# Patient Record
Sex: Male | Born: 1975 | Race: White | Hispanic: No | Marital: Married | Smoking: Never smoker
Health system: Southern US, Community
[De-identification: ages and names within clinical notes are randomized; demographics above are authoritative.]

## PROBLEM LIST (undated history)

## (undated) DIAGNOSIS — I10 Essential (primary) hypertension: Secondary | ICD-10-CM

## (undated) DIAGNOSIS — M5126 Other intervertebral disc displacement, lumbar region: Secondary | ICD-10-CM

## (undated) HISTORY — PX: ANTERIOR CRUCIATE LIGAMENT REPAIR: SHX115

## (undated) HISTORY — DX: Essential (primary) hypertension: I10

## (undated) HISTORY — PX: LUMBAR EPIDURAL INJECTION: SHX1980

---

## 2018-11-23 DIAGNOSIS — I10 Essential (primary) hypertension: Secondary | ICD-10-CM | POA: Insufficient documentation

## 2021-04-27 ENCOUNTER — Other Ambulatory Visit: Payer: Self-pay | Admitting: Sports Medicine

## 2021-04-27 DIAGNOSIS — G8929 Other chronic pain: Secondary | ICD-10-CM

## 2021-04-27 DIAGNOSIS — M5442 Lumbago with sciatica, left side: Secondary | ICD-10-CM

## 2021-04-27 DIAGNOSIS — M5136 Other intervertebral disc degeneration, lumbar region: Secondary | ICD-10-CM

## 2021-05-04 ENCOUNTER — Ambulatory Visit
Admission: RE | Admit: 2021-05-04 | Discharge: 2021-05-04 | Disposition: A | Discharge status: Home / Self Care | Payer: 59 | Source: Ambulatory Visit | Attending: Sports Medicine | Admitting: Sports Medicine

## 2021-05-04 ENCOUNTER — Other Ambulatory Visit: Payer: Self-pay

## 2021-05-04 DIAGNOSIS — M5136 Other intervertebral disc degeneration, lumbar region: Secondary | ICD-10-CM | POA: Diagnosis present

## 2021-05-04 DIAGNOSIS — G8929 Other chronic pain: Secondary | ICD-10-CM | POA: Diagnosis present

## 2021-05-04 DIAGNOSIS — M5442 Lumbago with sciatica, left side: Secondary | ICD-10-CM | POA: Insufficient documentation

## 2021-05-20 ENCOUNTER — Other Ambulatory Visit (INDEPENDENT_AMBULATORY_CARE_PROVIDER_SITE_OTHER): Payer: Self-pay | Admitting: Vascular Surgery

## 2021-05-20 ENCOUNTER — Ambulatory Visit (INDEPENDENT_AMBULATORY_CARE_PROVIDER_SITE_OTHER): Payer: 59

## 2021-05-20 ENCOUNTER — Other Ambulatory Visit: Payer: Self-pay

## 2021-05-20 DIAGNOSIS — R238 Other skin changes: Secondary | ICD-10-CM | POA: Diagnosis not present

## 2021-05-20 DIAGNOSIS — M79602 Pain in left arm: Secondary | ICD-10-CM

## 2021-05-20 DIAGNOSIS — M79601 Pain in right arm: Secondary | ICD-10-CM

## 2021-05-23 DIAGNOSIS — I998 Other disorder of circulatory system: Secondary | ICD-10-CM | POA: Insufficient documentation
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
## 2021-05-24 ENCOUNTER — Ambulatory Visit (INDEPENDENT_AMBULATORY_CARE_PROVIDER_SITE_OTHER): Payer: 59 | Admitting: Vascular Surgery

## 2021-05-24 ENCOUNTER — Encounter (INDEPENDENT_AMBULATORY_CARE_PROVIDER_SITE_OTHER): Payer: Self-pay | Admitting: Vascular Surgery

## 2021-05-24 ENCOUNTER — Other Ambulatory Visit: Payer: Self-pay

## 2021-05-24 VITALS — BP 155/100 | HR 52 | Resp 16 | Ht 68.5 in | Wt 183.8 lb

## 2021-05-24 DIAGNOSIS — I10 Essential (primary) hypertension: Secondary | ICD-10-CM

## 2021-05-24 DIAGNOSIS — I742 Embolism and thrombosis of arteries of the upper extremities: Secondary | ICD-10-CM | POA: Diagnosis not present

## 2021-05-24 DIAGNOSIS — I998 Other disorder of circulatory system: Secondary | ICD-10-CM

## 2021-05-25 NOTE — Progress Notes (Signed)
MRN : [id number]  Andrew Navarro is a 45 y.o. (1975/11/22) male who presents with chief complaint of cold fingers.  History of Present Illness:   The patient is seen for follow up evaluation of ischemic right fingers status post CT angiogram. CT scan was done 05/26/2021. Patient reports that the test went well with no problems or complications.   The patient denies interval increase in his hand symptoms. No new ulcers or fissures have occurred.   The patient is taking enteric-coated aspirin 325 mg daily.  There is no history of migraine headaches. There is no history of seizures.  The patient has a history of coronary artery disease, no recent episodes of angina or shortness of breath. The patient denies PAD or claudication symptoms. There is a history of hyperlipidemia which is being treated with a statin.    CT angiogram is reviewed by me personally and shows normal arterial system from the aorta to the wrist. 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    No outpatient medications have been marked as taking for the 05/27/21 encounter (Appointment) with Gilda Crease, Latina Craver, MD.    Past Medical History:  Diagnosis Date   Hypertension     Past Surgical History:  Procedure Laterality Date   ANTERIOR CRUCIATE LIGAMENT REPAIR Left    20 years ago    Social History Social History   Tobacco Use   Smoking status: Never   Smokeless tobacco: Never  Substance Use Topics   Alcohol use: Yes    Comment: socially   Drug use: Never    Family History Family History  Problem Relation Age of Onset   Hypertension Mother    Hypertension Father     No Known Allergies   REVIEW OF SYSTEMS (Negative unless checked)  Constitutional: [] Weight loss  [] Fever  [] Chills Cardiac: [] Chest pain   [] Chest pressure   [] Palpitations   [] Shortness of breath when laying flat   [] Shortness of breath with exertion. Vascular:  [] Pain in legs with walking   [] Pain in legs at rest   [] History of DVT   [] Phlebitis   [] Swelling in legs   [] Varicose veins   [] Non-healing ulcers Pulmonary:   [] Uses home oxygen   [] Productive cough   [] Hemoptysis   [] Wheeze  [] COPD   [] Asthma Neurologic:  [] Dizziness   [] Seizures   [] History of stroke   [] History of TIA  [] Aphasia   [] Vissual changes   [] Weakness or numbness in arm   [] Weakness or numbness in leg Musculoskeletal:   [] Joint swelling   [] Joint pain   [] Low back pain Hematologic:  [] Easy bruising  [] Easy bleeding   [] Hypercoagulable state   [] Anemic Gastrointestinal:  [] Diarrhea   [] Vomiting  [] Gastroesophageal reflux/heartburn   [] Difficulty swallowing. Genitourinary:  [] Chronic kidney disease   [] Difficult urination  [] Frequent urination   [] Blood in urine Skin:  [] Rashes   [] Ulcers  Psychological:  [] History of anxiety   []  History of major depression.  Physical Examination  There were no vitals filed for this visit. There is no height or weight on file to calculate BMI. Gen: WD/WN, NAD Head: [place]/AT, No temporalis wasting.  Ear/Nose/Throat: Hearing grossly intact, nares w/o erythema or drainage Eyes: PER, EOMI, sclera nonicteric.  Neck: Supple, no masses.  No bruit or JVD.  Pulmonary:  Good air movement, no audible wheezing, no use of accessory muscles.  Cardiac: RRR, normal S1, S2, no Murmurs. Vascular:   right 3rd, 4th and 5th fingers with some  color change, cool to touch Vessel Right Left  Radial Palpable Palpable  Gastrointestinal: soft, non-distended. No guarding/no peritoneal signs.  Musculoskeletal: M/S 5/5 throughout.  No visible deformity.  Neurologic: CN 2-12 intact. Pain and light touch intact in extremities.  Symmetrical.  Speech is fluent. Motor exam as listed above. Psychiatric: Judgment intact, Mood & affect appropriate for pt's clinical situation. Dermatologic: No rashes or ulcers noted.  No changes consistent with cellulitis.   CBC No results found for: WBC, HGB, HCT, MCV, PLT  BMET No results found  for: NA, K, CL, CO2, GLUCOSE, BUN, CREATININE, CALCIUM, GFRNONAA, GFRAA CrCl cannot be calculated (No successful lab value found.).  COAG No results found for: INR, PROTIME  Radiology MR LUMBAR SPINE WO CONTRAST  Result Date: 05/05/2021 CLINICAL DATA:  Chronic left-sided low back pain with left-sided sciatica M54.42, G89.29 (ICD-10-CM)DDD (degenerative disc disease), lumbar M51.36 (ICD-10-CM). Additional history provided by scanning technologist: Patient reports low back pain with left leg pain for 4-5 months. EXAM: MRI LUMBAR SPINE WITHOUT CONTRAST TECHNIQUE: Multiplanar, multisequence MR imaging of the lumbar spine was performed. No intravenous contrast was administered. COMPARISON:  No pertinent prior exams available for comparison. FINDINGS: Segmentation: Transitional lumbosacral anatomy. For purposes of this dictation, the S1 vertebra is transitional. Rudimentary disc space and right-sided assimilation joint at S1-S2. Alignment:  No significant spondylolisthesis. Vertebrae: No vertebral compression deformity. Small Schmorl node within the L2 superior endplate. Small multilevel vertebral body hemangiomas. No significant marrow edema or focal suspicious osseous lesion. Conus medullaris and cauda equina: Conus extends to the T12 level. The distal spinal cord is only minimally included in the field of view. Paraspinal and other soft tissues: No abnormality identified within included portions of the abdomen/retroperitoneum. Paraspinal soft tissues unremarkable. Disc levels: Minimal disc degeneration at L4. Mild-to-moderate disc degeneration at L4-L5. Intervertebral disc height is otherwise maintained. T12-L1: No significant disc herniation or stenosis. L1-L2: No significant disc herniation or stenosis. L2-L3: Tiny left foraminal/extraforaminal disc protrusion (series 8, image 16) (series 5, image 12). No significant spinal canal or foraminal stenosis. L3-L4: No significant disc herniation or stenosis.  L4-L5: Small disc bulge with endplate spurring. Superimposed broad-based left subarticular/foraminal disc protrusion at site of posterior annular fissure. Mild facet arthrosis/ligamentum flavum hypertrophy on the left. The disc protrusion contributes to mild left subarticular narrowing without appreciable nerve root impingement. Central canal patent. Borderline mild right neural foraminal narrowing. Mild/moderate left neural foraminal narrowing. L5-S1: Right foraminal zone posterior annular fissure. Disc bulge with endplate spurring. Superimposed broad-based left center to left foraminal disc protrusion. The disc protrusion results in left subarticular stenosis, contacting and posteriorly displacing the descending left S1 nerve root. The disc protrusion also contributes to moderate left neural foraminal narrowing, contacting the exiting left L5 nerve root. IMPRESSION: Transitional lumbosacral anatomy, as described. Lumbar spondylosis, as outlined and with findings most notably as follows. At L5-S1, there is mild-to-moderate disc degeneration. Right foraminal zone posterior annular fissure. Disc bulge with endplate spurring. Superimposed broad-based left center to left foraminal disc protrusion. The disc protrusion results in left subarticular stenosis, contacting and posteriorly displacing the descending left S1 nerve root. Correlate for left S1 radiculopathy. The disc protrusion also contributes to moderate left neural foraminal narrowing, contacting the exiting left L5 nerve root. Correlate for left L5 radiculopathy. At L4-L5, there is mild disc degeneration. Small disc bulge with endplate spurring. Superimposed broad-based left subarticular/foraminal disc protrusion at site of posterior annular fissure. Mild facet arthrosis/ligamentum flavum hypertrophy on the left. The disc protrusion contributes to  mild left subarticular narrowing without appreciable nerve root impingement. Mild/moderate left neural foraminal  narrowing. Borderline mild right neural foraminal narrowing. No significant spinal canal or foraminal stenosis at the remaining levels. Electronically Signed   By: Jackey Loge DO   On: 05/05/2021 16:35   VAS [place] UPPER EXTREMITY VENOUS DUPLEX  Result Date: 05/24/2021 UPPER VENOUS STUDY  Patient Name:  AVISH TORRY  Date of Exam:   05/20/2021 Medical Rec #: [id number]      Accession #:    [id number] Date of Birth: 12/03/1975     Patient Gender: M Patient Age:   83 years Exam [facility] Procedure:      VAS [place] UPPER EXTREMITY VENOUS DUPLEX Referring Phys: Levora Dredge --------------------------------------------------------------------------------  Other Indications: Right hand pain s/p body building. Performing Technologist: Salvadore Farber RVT  Examination Guidelines: A complete evaluation includes B-mode imaging, spectral Doppler, color Doppler, and power Doppler as needed of all accessible portions of each vessel. Bilateral testing is considered an integral part of a complete examination. Limited examinations for reoccurring indications may be performed as noted.  Right Findings: +----------+------------+---------+-----------+----------+-------+ RIGHT     CompressiblePhasicitySpontaneousPropertiesSummary +----------+------------+---------+-----------+----------+-------+ IJV           Full       Yes       Yes                      +----------+------------+---------+-----------+----------+-------+ Subclavian    Full       Yes       Yes                      +----------+------------+---------+-----------+----------+-------+ Axillary      Full       Yes       Yes                      +----------+------------+---------+-----------+----------+-------+ Brachial      Full       Yes       Yes                      +----------+------------+---------+-----------+----------+-------+ Radial        Full       Yes       Yes                       +----------+------------+---------+-----------+----------+-------+ Ulnar         Full       Yes       Yes                      +----------+------------+---------+-----------+----------+-------+ Cephalic      Full       Yes       Yes                      +----------+------------+---------+-----------+----------+-------+ Basilic       Full       Yes       Yes                      +----------+------------+---------+-----------+----------+-------+  Summary:  Right: No evidence of deep vein thrombosis in the upper extremity. No evidence of superficial vein thrombosis in the upper extremity. Added arterial duplex of radial and ulnar arteries shows the distal radial artery is occluded 1cm from wrist and a collateral  is seen with retrograde flow. Palmar arch waveforms appear diminished. Added right digit waveforms show diminished flow in middle, 4th, and 5th digits.  *See table(s) above for measurements and observations.  Diagnosing physician: Levora Dredge MD Electronically signed by Levora Dredge MD on 05/24/2021 at 8:24:22 AM.    Final      Assessment/Plan 1. 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  This represents  ischemia of the fingers and places the patient at possible risk for finger loss.  Patient should undergo angiography of the right arm  with the hope for intervention.  The risks and benefits as well as the alternative therapies was discussed in detail with the patient.  All questions were answered.  Patient agrees to proceed with angiography.  The patient will follow up with me in the office after the procedure.       2. Ulnar artery thrombus, right (HCC) See #1  3. Essential hypertension Continue antihypertensive medications as already ordered, these medications have been reviewed and there are no changes at this time.     Levora Dredge, MD  05/25/2021 10:11 AM

## 2021-05-25 NOTE — H&P (View-Only) (Signed)
MRN : [id number]  Andrew Navarro is a 45 y.o. (1975/11/22) male who presents with chief complaint of cold fingers.  History of Present Illness:   The patient is seen for follow up evaluation of ischemic right fingers status post CT angiogram. CT scan was done 05/26/2021. Patient reports that the test went well with no problems or complications.   The patient denies interval increase in his hand symptoms. No new ulcers or fissures have occurred.   The patient is taking enteric-coated aspirin 325 mg daily.  There is no history of migraine headaches. There is no history of seizures.  The patient has a history of coronary artery disease, no recent episodes of angina or shortness of breath. The patient denies PAD or claudication symptoms. There is a history of hyperlipidemia which is being treated with a statin.    CT angiogram is reviewed by me personally and shows normal arterial system from the aorta to the wrist. 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    No outpatient medications have been marked as taking for the 05/27/21 encounter (Appointment) with Gilda Crease, Latina Craver, MD.    Past Medical History:  Diagnosis Date   Hypertension     Past Surgical History:  Procedure Laterality Date   ANTERIOR CRUCIATE LIGAMENT REPAIR Left    20 years ago    Social History Social History   Tobacco Use   Smoking status: Never   Smokeless tobacco: Never  Substance Use Topics   Alcohol use: Yes    Comment: socially   Drug use: Never    Family History Family History  Problem Relation Age of Onset   Hypertension Mother    Hypertension Father     No Known Allergies   REVIEW OF SYSTEMS (Negative unless checked)  Constitutional: [] Weight loss  [] Fever  [] Chills Cardiac: [] Chest pain   [] Chest pressure   [] Palpitations   [] Shortness of breath when laying flat   [] Shortness of breath with exertion. Vascular:  [] Pain in legs with walking   [] Pain in legs at rest   [] History of DVT   [] Phlebitis   [] Swelling in legs   [] Varicose veins   [] Non-healing ulcers Pulmonary:   [] Uses home oxygen   [] Productive cough   [] Hemoptysis   [] Wheeze  [] COPD   [] Asthma Neurologic:  [] Dizziness   [] Seizures   [] History of stroke   [] History of TIA  [] Aphasia   [] Vissual changes   [] Weakness or numbness in arm   [] Weakness or numbness in leg Musculoskeletal:   [] Joint swelling   [] Joint pain   [] Low back pain Hematologic:  [] Easy bruising  [] Easy bleeding   [] Hypercoagulable state   [] Anemic Gastrointestinal:  [] Diarrhea   [] Vomiting  [] Gastroesophageal reflux/heartburn   [] Difficulty swallowing. Genitourinary:  [] Chronic kidney disease   [] Difficult urination  [] Frequent urination   [] Blood in urine Skin:  [] Rashes   [] Ulcers  Psychological:  [] History of anxiety   []  History of major depression.  Physical Examination  There were no vitals filed for this visit. There is no height or weight on file to calculate BMI. Gen: WD/WN, NAD Head: [place]/AT, No temporalis wasting.  Ear/Nose/Throat: Hearing grossly intact, nares w/o erythema or drainage Eyes: PER, EOMI, sclera nonicteric.  Neck: Supple, no masses.  No bruit or JVD.  Pulmonary:  Good air movement, no audible wheezing, no use of accessory muscles.  Cardiac: RRR, normal S1, S2, no Murmurs. Vascular:   right 3rd, 4th and 5th fingers with some  color change, cool to touch Vessel Right Left  Radial Palpable Palpable  Gastrointestinal: soft, non-distended. No guarding/no peritoneal signs.  Musculoskeletal: M/S 5/5 throughout.  No visible deformity.  Neurologic: CN 2-12 intact. Pain and light touch intact in extremities.  Symmetrical.  Speech is fluent. Motor exam as listed above. Psychiatric: Judgment intact, Mood & affect appropriate for pt's clinical situation. Dermatologic: No rashes or ulcers noted.  No changes consistent with cellulitis.   CBC No results found for: WBC, HGB, HCT, MCV, PLT  BMET No results found  for: NA, K, CL, CO2, GLUCOSE, BUN, CREATININE, CALCIUM, GFRNONAA, GFRAA CrCl cannot be calculated (No successful lab value found.).  COAG No results found for: INR, PROTIME  Radiology MR LUMBAR SPINE WO CONTRAST  Result Date: 05/05/2021 CLINICAL DATA:  Chronic left-sided low back pain with left-sided sciatica M54.42, G89.29 (ICD-10-CM)DDD (degenerative disc disease), lumbar M51.36 (ICD-10-CM). Additional history provided by scanning technologist: Patient reports low back pain with left leg pain for 4-5 months. EXAM: MRI LUMBAR SPINE WITHOUT CONTRAST TECHNIQUE: Multiplanar, multisequence MR imaging of the lumbar spine was performed. No intravenous contrast was administered. COMPARISON:  No pertinent prior exams available for comparison. FINDINGS: Segmentation: Transitional lumbosacral anatomy. For purposes of this dictation, the S1 vertebra is transitional. Rudimentary disc space and right-sided assimilation joint at S1-S2. Alignment:  No significant spondylolisthesis. Vertebrae: No vertebral compression deformity. Small Schmorl node within the L2 superior endplate. Small multilevel vertebral body hemangiomas. No significant marrow edema or focal suspicious osseous lesion. Conus medullaris and cauda equina: Conus extends to the T12 level. The distal spinal cord is only minimally included in the field of view. Paraspinal and other soft tissues: No abnormality identified within included portions of the abdomen/retroperitoneum. Paraspinal soft tissues unremarkable. Disc levels: Minimal disc degeneration at L4. Mild-to-moderate disc degeneration at L4-L5. Intervertebral disc height is otherwise maintained. T12-L1: No significant disc herniation or stenosis. L1-L2: No significant disc herniation or stenosis. L2-L3: Tiny left foraminal/extraforaminal disc protrusion (series 8, image 16) (series 5, image 12). No significant spinal canal or foraminal stenosis. L3-L4: No significant disc herniation or stenosis.  L4-L5: Small disc bulge with endplate spurring. Superimposed broad-based left subarticular/foraminal disc protrusion at site of posterior annular fissure. Mild facet arthrosis/ligamentum flavum hypertrophy on the left. The disc protrusion contributes to mild left subarticular narrowing without appreciable nerve root impingement. Central canal patent. Borderline mild right neural foraminal narrowing. Mild/moderate left neural foraminal narrowing. L5-S1: Right foraminal zone posterior annular fissure. Disc bulge with endplate spurring. Superimposed broad-based left center to left foraminal disc protrusion. The disc protrusion results in left subarticular stenosis, contacting and posteriorly displacing the descending left S1 nerve root. The disc protrusion also contributes to moderate left neural foraminal narrowing, contacting the exiting left L5 nerve root. IMPRESSION: Transitional lumbosacral anatomy, as described. Lumbar spondylosis, as outlined and with findings most notably as follows. At L5-S1, there is mild-to-moderate disc degeneration. Right foraminal zone posterior annular fissure. Disc bulge with endplate spurring. Superimposed broad-based left center to left foraminal disc protrusion. The disc protrusion results in left subarticular stenosis, contacting and posteriorly displacing the descending left S1 nerve root. Correlate for left S1 radiculopathy. The disc protrusion also contributes to moderate left neural foraminal narrowing, contacting the exiting left L5 nerve root. Correlate for left L5 radiculopathy. At L4-L5, there is mild disc degeneration. Small disc bulge with endplate spurring. Superimposed broad-based left subarticular/foraminal disc protrusion at site of posterior annular fissure. Mild facet arthrosis/ligamentum flavum hypertrophy on the left. The disc protrusion contributes to   mild left subarticular narrowing without appreciable nerve root impingement. Mild/moderate left neural foraminal  narrowing. Borderline mild right neural foraminal narrowing. No significant spinal canal or foraminal stenosis at the remaining levels. Electronically Signed   By: Jackey Loge DO   On: 05/05/2021 16:35   VAS [place] UPPER EXTREMITY VENOUS DUPLEX  Result Date: 05/24/2021 UPPER VENOUS STUDY  Patient Name:  Andrew Navarro  Date of Exam:   05/20/2021 Medical Rec #: [id number]      Accession #:    [id number] Date of Birth: 12/03/1975     Patient Gender: M Patient Age:   83 years Exam Location:  [facility] Procedure:      VAS [place] UPPER EXTREMITY VENOUS DUPLEX Referring Phys: Levora Dredge --------------------------------------------------------------------------------  Other Indications: Right hand pain s/p body building. Performing Technologist: Salvadore Farber RVT  Examination Guidelines: A complete evaluation includes B-mode imaging, spectral Doppler, color Doppler, and power Doppler as needed of all accessible portions of each vessel. Bilateral testing is considered an integral part of a complete examination. Limited examinations for reoccurring indications may be performed as noted.  Right Findings: +----------+------------+---------+-----------+----------+-------+ RIGHT     CompressiblePhasicitySpontaneousPropertiesSummary +----------+------------+---------+-----------+----------+-------+ IJV           Full       Yes       Yes                      +----------+------------+---------+-----------+----------+-------+ Subclavian    Full       Yes       Yes                      +----------+------------+---------+-----------+----------+-------+ Axillary      Full       Yes       Yes                      +----------+------------+---------+-----------+----------+-------+ Brachial      Full       Yes       Yes                      +----------+------------+---------+-----------+----------+-------+ Radial        Full       Yes       Yes                       +----------+------------+---------+-----------+----------+-------+ Ulnar         Full       Yes       Yes                      +----------+------------+---------+-----------+----------+-------+ Cephalic      Full       Yes       Yes                      +----------+------------+---------+-----------+----------+-------+ Basilic       Full       Yes       Yes                      +----------+------------+---------+-----------+----------+-------+  Summary:  Right: No evidence of deep vein thrombosis in the upper extremity. No evidence of superficial vein thrombosis in the upper extremity. Added arterial duplex of radial and ulnar arteries shows the distal radial artery is occluded 1cm from wrist and a collateral  is seen with retrograde flow. Palmar arch waveforms appear diminished. Added right digit waveforms show diminished flow in middle, 4th, and 5th digits.  *See table(s) above for measurements and observations.  Diagnosing physician: Levora Dredge MD Electronically signed by Levora Dredge MD on 05/24/2021 at 8:24:22 AM.    Final      Assessment/Plan 1. 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  This represents  ischemia of the fingers and places the patient at possible risk for finger loss.  Patient should undergo angiography of the right arm  with the hope for intervention.  The risks and benefits as well as the alternative therapies was discussed in detail with the patient.  All questions were answered.  Patient agrees to proceed with angiography.  The patient will follow up with me in the office after the procedure.       2. Ulnar artery thrombus, right (HCC) See #1  3. Essential hypertension Continue antihypertensive medications as already ordered, these medications have been reviewed and there are no changes at this time.     Levora Dredge, MD  05/25/2021 10:11 AM

## 2021-05-26 ENCOUNTER — Other Ambulatory Visit: Payer: Self-pay

## 2021-05-26 ENCOUNTER — Ambulatory Visit
Admission: RE | Admit: 2021-05-26 | Discharge: 2021-05-26 | Disposition: A | Discharge status: Home / Self Care | Payer: 59 | Source: Ambulatory Visit | Attending: Vascular Surgery | Admitting: Vascular Surgery

## 2021-05-26 DIAGNOSIS — I742 Embolism and thrombosis of arteries of the upper extremities: Secondary | ICD-10-CM | POA: Diagnosis present

## 2021-05-26 DIAGNOSIS — I998 Other disorder of circulatory system: Secondary | ICD-10-CM

## 2021-05-26 LAB — POCT I-STAT CREATININE: Creatinine, Ser: 1.1 mg/dL (ref 0.61–1.24)

## 2021-05-26 MED ORDER — IOHEXOL 350 MG/ML SOLN
125.0000 mL | Freq: Once | INTRAVENOUS | Status: AC | PRN
  Administered 2021-05-26: 125 mL via INTRAVENOUS

## 2021-05-27 ENCOUNTER — Encounter (INDEPENDENT_AMBULATORY_CARE_PROVIDER_SITE_OTHER): Payer: Self-pay

## 2021-05-27 ENCOUNTER — Encounter (INDEPENDENT_AMBULATORY_CARE_PROVIDER_SITE_OTHER): Payer: Self-pay | Admitting: Vascular Surgery

## 2021-05-27 ENCOUNTER — Telehealth (INDEPENDENT_AMBULATORY_CARE_PROVIDER_SITE_OTHER): Payer: Self-pay

## 2021-05-27 ENCOUNTER — Ambulatory Visit (INDEPENDENT_AMBULATORY_CARE_PROVIDER_SITE_OTHER): Payer: 59 | Admitting: Vascular Surgery

## 2021-05-27 VITALS — BP 132/85 | HR 58 | Resp 19 | Ht 68.0 in | Wt 180.0 lb

## 2021-05-27 DIAGNOSIS — I10 Essential (primary) hypertension: Secondary | ICD-10-CM

## 2021-05-27 DIAGNOSIS — I998 Other disorder of circulatory system: Secondary | ICD-10-CM | POA: Diagnosis not present

## 2021-05-27 DIAGNOSIS — I742 Embolism and thrombosis of arteries of the upper extremities: Secondary | ICD-10-CM | POA: Diagnosis not present

## 2021-05-27 NOTE — Telephone Encounter (Signed)
Patient has been schedule for right upper extremity angio with Dr Gilda Crease on 06/01/21 arrival time 11:30 at the [facility]. I left a voicemail for the patient return a call back to go over the pre-procedure instructions.

## 2021-05-30 ENCOUNTER — Encounter (INDEPENDENT_AMBULATORY_CARE_PROVIDER_SITE_OTHER): Payer: Self-pay | Admitting: Vascular Surgery

## 2021-06-01 ENCOUNTER — Other Ambulatory Visit (INDEPENDENT_AMBULATORY_CARE_PROVIDER_SITE_OTHER): Payer: Self-pay | Admitting: Nurse Practitioner

## 2021-06-01 ENCOUNTER — Ambulatory Visit
Admission: RE | Admit: 2021-06-01 | Discharge: 2021-06-01 | Disposition: A | Discharge status: Home / Self Care | Payer: 59 | Attending: Vascular Surgery | Admitting: Vascular Surgery

## 2021-06-01 ENCOUNTER — Other Ambulatory Visit: Payer: Self-pay

## 2021-06-01 ENCOUNTER — Encounter: Payer: Self-pay | Admitting: Vascular Surgery

## 2021-06-01 ENCOUNTER — Encounter
Admission: RE | Disposition: A | Discharge status: Home / Self Care | Payer: Self-pay | Source: Home / Self Care | Attending: Vascular Surgery

## 2021-06-01 DIAGNOSIS — I998 Other disorder of circulatory system: Secondary | ICD-10-CM | POA: Diagnosis not present

## 2021-06-01 DIAGNOSIS — I742 Embolism and thrombosis of arteries of the upper extremities: Secondary | ICD-10-CM | POA: Diagnosis not present

## 2021-06-01 DIAGNOSIS — E785 Hyperlipidemia, unspecified: Secondary | ICD-10-CM | POA: Diagnosis not present

## 2021-06-01 DIAGNOSIS — I10 Essential (primary) hypertension: Secondary | ICD-10-CM | POA: Insufficient documentation

## 2021-06-01 DIAGNOSIS — Z7982 Long term (current) use of aspirin: Secondary | ICD-10-CM | POA: Diagnosis not present

## 2021-06-01 DIAGNOSIS — Z8249 Family history of ischemic heart disease and other diseases of the circulatory system: Secondary | ICD-10-CM | POA: Insufficient documentation

## 2021-06-01 DIAGNOSIS — I2510 Atherosclerotic heart disease of native coronary artery without angina pectoris: Secondary | ICD-10-CM | POA: Insufficient documentation

## 2021-06-01 HISTORY — DX: Other intervertebral disc displacement, lumbar region: M51.26

## 2021-06-01 HISTORY — PX: UPPER EXTREMITY ANGIOGRAPHY: CATH118270

## 2021-06-01 LAB — CREATININE, SERUM
Creatinine, Ser: 1 mg/dL (ref 0.61–1.24)
GFR, Estimated: >60 mL/min (ref >60)

## 2021-06-01 LAB — BUN: BUN: 24 mg/dL — ABNORMAL HIGH (ref 6–20)

## 2021-06-01 SURGERY — UPPER EXTREMITY ANGIOGRAPHY
Anesthesia: Moderate Sedation | Site: Arm Upper | Laterality: Right

## 2021-06-01 MED ORDER — CLOPIDOGREL BISULFATE 75 MG PO TABS
ORAL_TABLET | ORAL | Status: AC
  Filled 2021-06-01: qty 4

## 2021-06-01 MED ORDER — FENTANYL CITRATE (PF) 100 MCG/2ML IJ SOLN
INTRAMUSCULAR | Status: DC | PRN
  Administered 2021-06-01: 50 ug via INTRAVENOUS
  Administered 2021-06-01: 25 ug via INTRAVENOUS
  Administered 2021-06-01 (×3): 50 ug via INTRAVENOUS

## 2021-06-01 MED ORDER — ALTEPLASE 2 MG IJ SOLR
INTRAMUSCULAR | Status: DC | PRN
  Administered 2021-06-01: 6 mg

## 2021-06-01 MED ORDER — ONDANSETRON HCL 4 MG/2ML IJ SOLN
INTRAMUSCULAR | Status: AC
  Filled 2021-06-01: qty 2

## 2021-06-01 MED ORDER — MIDAZOLAM HCL 2 MG/2ML IJ SOLN
INTRAMUSCULAR | Status: DC | PRN
  Administered 2021-06-01: 0.5 mg via INTRAVENOUS
  Administered 2021-06-01 (×2): 1 mg via INTRAVENOUS
  Administered 2021-06-01: 2 mg via INTRAVENOUS
  Administered 2021-06-01: 1 mg via INTRAVENOUS

## 2021-06-01 MED ORDER — ONDANSETRON HCL 4 MG/2ML IJ SOLN: 4.0000 mg | Freq: Four times a day (QID) | INTRAMUSCULAR | Status: DC | PRN

## 2021-06-01 MED ORDER — FAMOTIDINE 20 MG PO TABS: 40.0000 mg | ORAL_TABLET | Freq: Once | ORAL | Status: DC | PRN

## 2021-06-01 MED ORDER — CEFAZOLIN SODIUM-DEXTROSE 2-4 GM/100ML-% IV SOLN: 2.0000 g | Freq: Once | INTRAVENOUS | Status: AC

## 2021-06-01 MED ORDER — DIPHENHYDRAMINE HCL 50 MG/ML IJ SOLN: 50.0000 mg | Freq: Once | INTRAMUSCULAR | Status: DC | PRN

## 2021-06-01 MED ORDER — FENTANYL CITRATE (PF) 100 MCG/2ML IJ SOLN
INTRAMUSCULAR | Status: AC
  Filled 2021-06-01: qty 2

## 2021-06-01 MED ORDER — MIDAZOLAM HCL 5 MG/5ML IJ SOLN
INTRAMUSCULAR | Status: AC
  Filled 2021-06-01: qty 5

## 2021-06-01 MED ORDER — CLOPIDOGREL BISULFATE 300 MG PO TABS
300.0000 mg | ORAL_TABLET | Freq: Once | ORAL | Status: AC
  Administered 2021-06-01: 300 mg via ORAL

## 2021-06-01 MED ORDER — HEPARIN SODIUM (PORCINE) 1000 UNIT/ML IJ SOLN
INTRAMUSCULAR | Status: DC | PRN
  Administered 2021-06-01: 5000 [IU] via INTRAVENOUS

## 2021-06-01 MED ORDER — METHYLPREDNISOLONE SODIUM SUCC 125 MG IJ SOLR: 125.0000 mg | Freq: Once | INTRAMUSCULAR | Status: DC | PRN

## 2021-06-01 MED ORDER — CEFAZOLIN SODIUM-DEXTROSE 2-4 GM/100ML-% IV SOLN
INTRAVENOUS | Status: AC
  Administered 2021-06-01: 2 g via INTRAVENOUS
  Filled 2021-06-01: qty 100

## 2021-06-01 MED ORDER — NITROGLYCERIN 1 MG/10 ML FOR IR/CATH LAB
INTRA_ARTERIAL | Status: AC
  Filled 2021-06-01: qty 10

## 2021-06-01 MED ORDER — HEPARIN SODIUM (PORCINE) 1000 UNIT/ML IJ SOLN
INTRAMUSCULAR | Status: AC
  Filled 2021-06-01: qty 1

## 2021-06-01 MED ORDER — ALTEPLASE 1 MG/ML SYRINGE FOR VASCULAR PROCEDURE
INTRAMUSCULAR | Status: DC | PRN
  Administered 2021-06-01: 6 mg via INTRA_ARTERIAL

## 2021-06-01 MED ORDER — HYDROMORPHONE HCL 1 MG/ML IJ SOLN: 1.0000 mg | Freq: Once | INTRAMUSCULAR | Status: DC | PRN

## 2021-06-01 MED ORDER — MIDAZOLAM HCL 2 MG/ML PO SYRP: 8.0000 mg | ORAL_SOLUTION | Freq: Once | ORAL | Status: DC | PRN

## 2021-06-01 MED ORDER — CLOPIDOGREL BISULFATE 75 MG PO TABS: 75.0000 mg | ORAL_TABLET | Freq: Every day | ORAL | 4 refills | Status: AC

## 2021-06-01 MED ORDER — NITROGLYCERIN 1 MG/10 ML FOR IR/CATH LAB
INTRA_ARTERIAL | Status: DC | PRN
  Administered 2021-06-01: 300 ug
  Administered 2021-06-01: 100 ug via INTRA_ARTERIAL
  Administered 2021-06-01: 250 ug via INTRA_ARTERIAL

## 2021-06-01 MED ORDER — MIDAZOLAM HCL 2 MG/2ML IJ SOLN
INTRAMUSCULAR | Status: AC
  Filled 2021-06-01: qty 2

## 2021-06-01 MED ORDER — SODIUM CHLORIDE 0.9 % IV SOLN: INTRAVENOUS | Status: DC

## 2021-06-01 MED ORDER — ASPIRIN EC 81 MG PO TBEC: 81.0000 mg | DELAYED_RELEASE_TABLET | Freq: Every day | ORAL | 2 refills | Status: AC

## 2021-06-01 SURGICAL SUPPLY — 27 items
BALLN ULTRVRSE RX 1.5X40X200 (BALLOONS) ×2
BALLOON ULTRVRSE RX 1.5X40X200 (BALLOONS) ×1 IMPLANT
CANISTER PENUMBRA ENGINE (MISCELLANEOUS) ×2 IMPLANT
CATH ANGIO 5F PIGTAIL 100CM (CATHETERS) ×2 IMPLANT
CATH BEACON 5 .035 100 H1 TIP (CATHETERS) ×2 IMPLANT
CATH INDIGO CAT3 KIT (CATHETERS) ×2 IMPLANT
CATH LANTERN 025 150CM 45TIP (MICROCATHETER) ×2 IMPLANT
CATH SEEKER .018X150 (CATHETERS) ×2 IMPLANT
CATH ULTRV RX 1.25X40X200 (CATHETERS) ×2 IMPLANT
CATH ULTRV RX 2X40X200 (CATHETERS) ×2 IMPLANT
COVER PROBE U/S 5X48 (MISCELLANEOUS) ×2 IMPLANT
DEVICE STARCLOSE SE CLOSURE (Vascular Products) ×2 IMPLANT
DEVICE TORQUE .025-.038 (MISCELLANEOUS) ×2 IMPLANT
GLIDEWIRE ADV .014X300CM (WIRE) ×2 IMPLANT
GLIDEWIRE ANGLED SS 035X260CM (WIRE) ×2 IMPLANT
GUIDEWIRE PFTE-COATED .018X300 (WIRE) ×2 IMPLANT
KIT ENCORE 26 ADVANTAGE (KITS) ×2 IMPLANT
KIT MICROPUNCTURE NIT STIFF (SHEATH) ×2 IMPLANT
NEEDLE ENTRY 21GA 7CM ECHOTIP (NEEDLE) ×2 IMPLANT
PACK ANGIOGRAPHY (CUSTOM PROCEDURE TRAY) ×2 IMPLANT
SHEATH BRITE TIP 5FRX11 (SHEATH) ×2 IMPLANT
SHEATH SHUTTLE SELECT 6F (SHEATH) ×2 IMPLANT
TUBING CONTRAST HIGH PRESS 72 (TUBING) ×2 IMPLANT
VALVE CHECKFLO PERFORMER (SHEATH) ×2 IMPLANT
WIRE GUIDERIGHT .035X150 (WIRE) ×2 IMPLANT
WIRE HI TORQ VERSACORE 300 (WIRE) ×2 IMPLANT
WIRE RUNTHROUGH .014X300CM (WIRE) ×2 IMPLANT

## 2021-06-01 NOTE — Op Note (Signed)
[facility]  Percutaneous Study/Intervention Procedural Note   Date of Surgery: 06/01/2021,4:30 PM  Surgeon:Khalea Ventura, Dolores Lory   Pre-operative Diagnosis: Occlusion right ulnar artery; ischemia right third fourth and fifth fingers  Post-operative diagnosis:  Same  Procedure(s) Performed:  1.  Arch aortogram  2.  Right upper extremity angiography third order catheter placement  3.  Infusion of 8 mg of tPA into the distal right ulnar artery  4.  Mechanical thrombectomy of the distal ulnar artery using the penumbra CAT 3 device  5.  Percutaneous transluminal angioplasty of the right palmar arch and distal ulnar artery from 1.30mm to 2.0 mm  6.  Infusion intra-arterial nitroglycerin  7.  [employer] right common femoral artery   Anesthesia: Conscious sedation was administered by the interventional radiology RN under my direct supervision. IV Versed plus fentanyl were utilized. Continuous ECG, pulse oximetry and blood pressure was monitored throughout the entire procedure. Conscious sedation was administered for a total of 137 minutes.  Sheath: 6 French 90 cm shuttle sheath right common femoral artery retrograde  Contrast: 45 cc   Fluoroscopy Time: 15.9 minutes  Indications: Patient has documented distal ulnar thrombosis with ischemic changes to the third fourth and fifth fingers.  He is undergoing angiography and intervention with the hope of reestablishing a more normal flow pattern to the fingers  Procedure:  Diar Berkel a 45 y.o. male who was identified and appropriate procedural time out was performed.  The patient was then placed supine on the table and prepped and draped in the usual sterile fashion.  Ultrasound was used to evaluate the right common femoral artery.  It was echolucent and pulsatile indicating it is patent .  An ultrasound image was acquired for the permanent record.  A micropuncture needle was used to access the right common femoral artery  under direct ultrasound guidance.  The microwire was then advanced under fluoroscopic guidance without difficulty followed by the micro-sheath.  A 0.035 J wire was advanced without resistance and a 5Fr sheath was placed.    J-wire and pigtail catheter then advanced up into the aortic arch and an LAO projection of the arch is obtained.  Stiff angled Glidewire and an H1 catheter then used to select the innominate and then the right subclavian artery.  Serial angiography through the H1 catheter is then performed down to the proximal forearm.  5000 units of heparin was given.  An 035 versa core wire was then reintroduced through the H1 catheter and the catheter and 5 French sheath removed a 90 cm shuttle sheath was then advanced and positioned with its tip in the distal brachial artery.  A 150 mm seeker catheter was then advanced over the versa core wire.  The ulnar artery was then selected and magnified imaging of the ulnar including the distal portion of the palm of his hand was performed.  This confirmed the occlusion of the ulnar artery at the wrist with very poor filling of the palmar arch.  I then advanced a 0.014 advantage wire through the seeker catheter and exchanged the 035 seeker catheter for a 0.018 seeker catheter.  Using the combination of the advantage wire and the seeker catheter I was able to cross the occlusion hand-injection of contrast verified that I was intraluminal within the palmar arch and actually the digital artery of the middle finger.  At this point I infused 100 mcg of nitroglycerin through the seeker catheter I also administered 200 mcg of nitroglycerin through the shuttle sheath.  8 mg of tPA was reconstituted and then used to lace the occluded segment with tPA.  This was allowed to dwell for 30 minutes.  Hand-injection through the sheath demonstrated the ulnar artery remained occluded and the penumbra CAT 3 device was opened onto the field and prepped.  The 0.014 advantage wire  was then advanced again crossing the occluded segment in the seeker catheter was exchanged for the penumbra CAT 3.  A total of 4 passes were made with the CAT 3.  Follow-up imaging demonstrated improved flow in the palmar arch especially with filling from the radial into the third and fourth fingers now.  The distal ulnar remained occluded at the level of the wrist.  I then perform serial angioplasty beginning with a 1.25 mm x 40 mm Ultraverse balloon this was advanced so that the tip was in the patent palmar arch inflations were to 8 to 10 atm for approximately 1 minute.  3 serial inflations were required to get back to the level of patent ulnar.  Next a 1.5 mm x 40 mm Ultraverse balloon was used this was not is advanced as far distally and lastly a 2.0 mm x 40 mm Ultraverse balloon was utilized to angioplasty the distal ulnar artery.  2 inflations with each of these balloons were required both inflations were to 8 to 10 atm for approximately 1 minute.  More nitroglycerin was also administered at this time.  Follow-up imaging now demonstrated sluggish flow within the distal ulnar.  On magnified imaging with the catheter positioned in the distal arm ulnar it does appear to be patent however there remains significant irregularities the lumen and sluggish filling distally.  The improvement of the palmar arch and improved filling of the third and fourth fingers persists.  This point I elected to terminate the case and will continue his antiplatelet therapy and discuss possible surgical options with hand surgery.  Findings:   Aortogram: The aortic arch is widely patent and free of any evidence for abnormality or arterial disease  Right Upper Extremity: The innominate visualized portions of the right common carotid, subclavian axillary brachial arteries as well as the trifurcation in the proximal forearm are all widely patent and appear completely normal no abnormalities are identified.  The radial artery and  interosseous arteries are widely patent and there is no evidence for any distal small vessel disease or pruning of smaller branches consistent with embolization.  The ulnar artery occludes at approximately the level of the wrist.  The radial artery fills the palmar arch but supplies only the first and second fingers on initial evaluation.  Following mechanical thrombectomy in association with tPA the palmar arch now appears improved with filling of the third and fourth fingers via the radial artery.  The fifth finger remains relatively isolated.  Following angioplasty there is improvement in ulnar flow and patency of the ulnar however on magnified directed imaging the distal ulnar extending into the palm still has irregularity of the lumen.  On more proximal injection it appears to be more high resistance and there is preferential filling via the radial and interosseous.  There is no evidence for distal ulnar aneurysm.     Disposition: Patient was taken to the recovery room in stable condition having tolerated the procedure well.  Belenda Cruise Daniel Ritthaler 06/01/2021,4:30 PM

## 2021-06-01 NOTE — Interval H&P Note (Signed)
History and Physical Interval Note:  06/01/2021 11:46 AM  Andrew Navarro  has presented today for surgery, with the diagnosis of RT Upper Extremity Angiography   Ulnar Artery Occulsion.  The various methods of treatment have been discussed with the patient and family. After consideration of risks, benefits and other options for treatment, the patient has consented to  Procedure(s): UPPER EXTREMITY ANGIOGRAPHY (Right) as a surgical intervention.  The patient's history has been reviewed, patient examined, no change in status, stable for surgery.  I have reviewed the patient's chart and labs.  Questions were answered to the patient's satisfaction.     Levora Dredge

## 2021-06-03 ENCOUNTER — Encounter: Payer: Self-pay | Admitting: Vascular Surgery

## 2021-06-04 ENCOUNTER — Telehealth (INDEPENDENT_AMBULATORY_CARE_PROVIDER_SITE_OTHER): Payer: Self-pay | Admitting: Vascular Surgery

## 2021-06-04 NOTE — Telephone Encounter (Signed)
Patient called in stating Dr. Gilda Crease had sent in a referral for patient to see [facility] orth Dr. Louanne Skye.  [facility] is wanting all x-rays and photos Dr. Gilda Crease has taken of patient hand, so they can discuss moving forward with patients care.  Please advise

## 2021-06-04 NOTE — Telephone Encounter (Signed)
Records were fax to Dr Louanne Skye office at [phone]

## 2021-06-07 ENCOUNTER — Ambulatory Visit (INDEPENDENT_AMBULATORY_CARE_PROVIDER_SITE_OTHER): Payer: 59 | Admitting: Vascular Surgery

## 2021-06-07 ENCOUNTER — Other Ambulatory Visit: Payer: Self-pay

## 2021-06-08 NOTE — Telephone Encounter (Signed)
Patient called back stating Dr. Louanne Skye office never rec'd his records from [place]. [facility] states they couldn't access any photos of his angiogram. Patient will be coming in on Thursday for a follow up and was wondering if he could possibly get copies to give them.

## 2021-06-10 ENCOUNTER — Other Ambulatory Visit: Payer: Self-pay

## 2021-06-10 ENCOUNTER — Encounter (INDEPENDENT_AMBULATORY_CARE_PROVIDER_SITE_OTHER): Payer: Self-pay | Admitting: Vascular Surgery

## 2021-06-10 ENCOUNTER — Ambulatory Visit (INDEPENDENT_AMBULATORY_CARE_PROVIDER_SITE_OTHER): Payer: 59 | Admitting: Vascular Surgery

## 2021-06-10 VITALS — BP 142/79 | HR 66 | Resp 16 | Wt 181.0 lb

## 2021-06-10 DIAGNOSIS — I742 Embolism and thrombosis of arteries of the upper extremities: Secondary | ICD-10-CM | POA: Diagnosis not present

## 2021-06-10 DIAGNOSIS — I998 Other disorder of circulatory system: Secondary | ICD-10-CM | POA: Diagnosis not present

## 2021-06-10 NOTE — Progress Notes (Signed)
MRN : [id number]  Andrew RowerJustin Elpers is a 45 y.o. (March 26, 1976) male who presents with chief complaint of painful right fingers.  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  He nots his fingers are somewhat better after the angiogram but still hurt and he still has a few splinter hemorrhages that are more painful.  [facility] and there are plans for revascularization.  Current Meds  Medication Sig   ascorbic acid (VITAMIN C) 500 MG tablet Take 500 mg by mouth in the morning.   aspirin EC 81 MG tablet Take 1 tablet (81 mg total) by mouth daily.   clopidogrel (PLAVIX) 75 MG tablet Take 1 tablet (75 mg total) by mouth daily.   ibuprofen (ADVIL) 200 MG tablet Take 400 mg by mouth every 8 (eight) hours as needed (for pain.).   losartan (COZAAR) 50 MG tablet Take 50 mg by mouth every evening.   Multiple Vitamin (MULTIVITAMIN WITH MINERALS) TABS tablet Take 1 tablet by mouth in the morning.   Multiple Vitamins-Minerals (IMMUNE SUPPORT PO) Take 1 tablet by mouth in the morning. Zinc + D3   Omega-3 Fatty Acids (FISH OIL PO) Take 1,380 mg by mouth every evening.    Past Medical History:  Diagnosis Date   Hypertension    Lumbar herniated disc     Past Surgical History:  Procedure Laterality Date   ANTERIOR CRUCIATE LIGAMENT REPAIR Left    20 years ago   LUMBAR EPIDURAL INJECTION     UPPER EXTREMITY ANGIOGRAPHY Right 06/01/2021   Procedure: UPPER EXTREMITY ANGIOGRAPHY;  Surgeon: Renford DillsSchnier, Quentyn Kolbeck G, MD;  Location: [facility] INVASIVE CV LAB;  Service: Cardiovascular;  Laterality: Right;    Social History Social History   Tobacco Use   Smoking status: Never   Smokeless tobacco: Never  Substance Use Topics   Alcohol use: Yes    Comment: socially   Drug use: Never    Family History Family History  Problem Relation Age of Onset    Hypertension Mother    Hypertension Father     No Known Allergies   REVIEW OF SYSTEMS (Negative unless checked)  Constitutional: [] Weight loss  [] Fever  [] Chills Cardiac: [] Chest pain   [] Chest pressure   [] Palpitations   [] Shortness of breath when laying flat   [] Shortness of breath with exertion. Vascular:  [] Pain in legs with walking   [] Pain in legs at rest  [] History of DVT   [] Phlebitis   [] Swelling in legs   [] Varicose veins   [] Non-healing ulcers Pulmonary:   [] Uses home oxygen   [] Productive cough   [] Hemoptysis   [] Wheeze  [] COPD   [] Asthma Neurologic:  [] Dizziness   [] Seizures   [] History of stroke   [] History of TIA  [] Aphasia   [] Vissual changes   [] Weakness or numbness in arm   [] Weakness or numbness in leg Musculoskeletal:   [] Joint swelling   [] Joint pain   [] Low back pain Hematologic:  [] Easy bruising  [] Easy bleeding   [] Hypercoagulable state   [] Anemic Gastrointestinal:  [] Diarrhea   [] Vomiting  [] Gastroesophageal reflux/heartburn   [] Difficulty swallowing. Genitourinary:  [] Chronic kidney disease   [] Difficult urination  [] Frequent urination   [] Blood in urine Skin:  [] Rashes   [] Ulcers  Psychological:  [] History of anxiety   []  History of major depression.  Physical Examination  Vitals:  06/10/21 1148  BP: (!) 142/79  Pulse: 66  Resp: 16  Weight: 181 lb (82.1 kg)   Body mass index is 27.52 kg/m. Gen: WD/WN, NAD Head: Geraldine/AT, No temporalis wasting.  Ear/Nose/Throat: Hearing grossly intact, nares w/o erythema or drainage Eyes: PER, EOMI, sclera nonicteric.  Neck: Supple, no masses.  No bruit or JVD.  Pulmonary:  Good air movement, no audible wheezing, no use of accessory muscles.  Cardiac: RRR, normal S1, S2, no Murmurs. Vascular:   Right 4th and 5th fingers are pink and fairly warm.  Several splinter hemorrhages are noted no fissure or ulcers. Vessel Right Left  Radial Palpable Palpable  Ulnar Not Palpable Palpable  Gastrointestinal: soft, non-distended.  No guarding/no peritoneal signs.  Musculoskeletal: M/S 5/5 throughout.  No visible deformity.  Neurologic: CN 2-12 intact. Pain and light touch intact in extremities.  Symmetrical.  Speech is fluent. Motor exam as listed above. Psychiatric: Judgment intact, Mood & affect appropriate for pt's clinical situation. Dermatologic: No rashes or ulcers noted.  No changes consistent with cellulitis.   CBC No results found for: WBC, HGB, HCT, MCV, PLT  BMET    Component Value Date/Time   BUN 24 (H) 06/01/2021 1206   CREATININE 1.00 06/01/2021 1206   GFRNONAA >60 06/01/2021 1206   Estimated Creatinine Clearance: 98.5 mL/min (by C-G formula based on SCr of 1 mg/dL).  COAG No results found for: INR, PROTIME  Radiology CT ANGIO UP EXTREM RIGHT W &/OR WO CONTRAST  Result Date: 05/26/2021 CLINICAL DATA:  Upper extremity artery dissection ischemic right 4-5th fingers probalbe embolism looking for source, subclavian artery injury or aneurysm EXAM: CT OF THE UPPER RIGHT EXTREMITY WITH CONTRAST TECHNIQUE: Multidetector CT imaging of the upper right extremity was performed according to the standard protocol following intravenous contrast administration. CONTRAST:  OMNIPAQUE IOHEXOL 350 MG/ML SOLN COMPARISON:  CTA chest dated same day FINDINGS: VASCULAR Subclavian artery: Patent without aneurysm or dissection. Axillary artery: Patent without aneurysm or dissection. Brachial artery: Patent without aneurysm or dissection. No findings of fibromuscular dysplasia. Radial artery: Patent without aneurysm or dissection to the level of the wrist. Ulnar artery: Patent without aneurysm or dissection to the level of the wrist. Palmar/digital arteries: Not well evaluated on this exam. NONVASCULAR No aggressive osseous lesions.  The soft tissues are unremarkable. IMPRESSION: The right upper extremity arteries are patent without aneurysm or dissection. The radial and ulnar arteries are visualized to the level of the  wrist. The arteries of the hand are not well evaluated on this exam. Electronically Signed   By: Olive Bass M.D.   On: 05/26/2021 09:44   PERIPHERAL VASCULAR CATHETERIZATION  Result Date: 06/01/2021 See surgical note for result.  CT ANGIO CHEST AORTA W/CM & OR WO/CM  Result Date: 05/26/2021 CLINICAL DATA:  Thoracic aorta disease, pre-op planning ischemic right 4-5th fingers probalbe embolism looking for source, subclavian artery injury or aneurysm EXAM: CT ANGIOGRAPHY CHEST WITH CONTRAST TECHNIQUE: Multidetector CT imaging of the chest was performed using the standard protocol during bolus administration of intravenous contrast. Multiplanar CT image reconstructions and MIPs were obtained to evaluate the vascular anatomy. CONTRAST:  OMNIPAQUE IOHEXOL 350 MG/ML SOLN COMPARISON:  None. FINDINGS: Cardiovascular: Preferential opacification of the thoracic aorta. No evidence of thoracic aortic aneurysm or dissection. Normal heart size. No pericardial effusion. The supra-aortic branch vessels demonstrate conventional branching pattern and are widely patent without aneurysm or dissection. Mediastinum/Nodes: No enlarged mediastinal, hilar, or axillary lymph nodes. The visualized thyroid gland appears normal. Lungs/Pleura:  No pleural effusion. No pneumothorax. No mass or focal consolidation. No suspicious pulmonary nodules. Musculoskeletal: No aggressive osseous lesions. Upper abdomen: The visualized upper abdomen is unremarkable. Review of the MIP images confirms the above findings. IMPRESSION: The thoracic aorta and proximal supra-aortic branch vessels are normal in caliber without aneurysm or dissection. No source of thromboembolic disease is identified in the chest. Electronically Signed   By: Olive Bass M.D.   On: 05/26/2021 09:32   VAS [place] UPPER EXTREMITY VENOUS DUPLEX  Addendum Date: 06/10/2021   Right ulnar artery occluded at wrist as images show. Right radial artery widely patent. Salvadore Farber  RVT Electronically Amended 06/10/2021, 1:12 PM   Final [facility])    Result Date: 06/10/2021 UPPER VENOUS STUDY  Patient Name:  Andrew Navarro  Date of Exam:   05/20/2021 Medical Rec #: [id number]      Accession #:    [id number] Date of Birth: [birth year]     Patient Gender: M Patient Age:   45 years Exam Location:  [facility] Procedure:      VAS [place] UPPER EXTREMITY VENOUS DUPLEX Referring Phys: Levora Dredge --------------------------------------------------------------------------------  Other Indications: Right hand pain s/p body building. Performing Technologist: Salvadore Farber RVT  Examination Guidelines: A complete evaluation includes B-mode imaging, spectral Doppler, color Doppler, and power Doppler as needed of all accessible portions of each vessel. Bilateral testing is considered an integral part of a complete examination. Limited examinations for reoccurring indications may be performed as noted.  Right Findings: +----------+------------+---------+-----------+----------+-------+ RIGHT     CompressiblePhasicitySpontaneousPropertiesSummary +----------+------------+---------+-----------+----------+-------+ IJV           Full       Yes       Yes                      +----------+------------+---------+-----------+----------+-------+ Subclavian    Full       Yes       Yes                      +----------+------------+---------+-----------+----------+-------+ Axillary      Full       Yes       Yes                      +----------+------------+---------+-----------+----------+-------+ Brachial      Full       Yes       Yes                      +----------+------------+---------+-----------+----------+-------+ Radial        Full       Yes       Yes                      +----------+------------+---------+-----------+----------+-------+ Ulnar         Full       Yes       Yes                      +----------+------------+---------+-----------+----------+-------+  Cephalic      Full       Yes       Yes                      +----------+------------+---------+-----------+----------+-------+ Basilic       Full       Yes       Yes                      +----------+------------+---------+-----------+----------+-------+  Summary:  Right: No evidence of deep vein thrombosis in the upper extremity. No evidence of superficial vein thrombosis in the upper extremity. Added arterial duplex of radial and ulnar arteries shows the distal radial artery is occluded 1cm from wrist and a collateral is seen with retrograde flow. Palmar arch waveforms appear diminished. Added right digit waveforms show diminished flow in middle, 4th, and 5th digits.  *See table(s) above for measurements and observations.  Diagnosing physician: Levora Dredge MD Electronically signed by Levora Dredge MD on 05/24/2021 at 8:24:22 AM.    Final (Amended)      Assessment/Plan 1. Ischemic finger I have reviewed the angiogram with the patient run by run.  We have discussed the probable cause of the clot as an aneurysm and in particular why it wasn't visualized on the angiogram.  He had a list of questions which I did my best to answer.  He will continue follow up with [facility]  and is planning to move forward with revasularization.  2. Ulnar artery thrombus, right (HCC) See #1    Levora Dredge, MD  06/10/2021 6:27 PM

## 2022-08-08 ENCOUNTER — Encounter (INDEPENDENT_AMBULATORY_CARE_PROVIDER_SITE_OTHER): Payer: Self-pay

## 2023-03-15 IMAGING — MR MR LUMBAR SPINE W/O CM
4 of 5 series · 29 of 48 positions shown · non-contrast
Comparison: No pertinent prior exams available for comparison.

CLINICAL DATA: Chronic left-sided low back pain with left-sided
sciatica M54.42, GIX.BX (ZAU-7S-CM)DDD (degenerative disc disease),
lumbar 63F.1Q (ZAU-7S-CM). Additional history provided by scanning
technologist: Patient reports low back pain with left leg pain for
4-5 months.

EXAM:
MRI LUMBAR SPINE WITHOUT CONTRAST
TECHNIQUE: Multiplanar, multisequence MR imaging of the lumbar spine was
performed. No intravenous contrast was administered.

[Series 5: T2 · sagittal · 4.0mm · 0.81mm/px · 6 of 17 slices shown (1 of 2)]
[im 1/17]
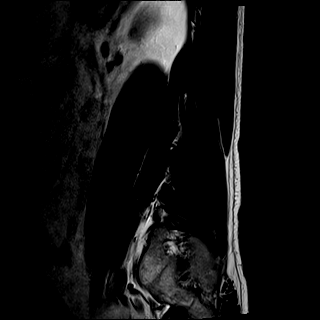
[im 4/17]
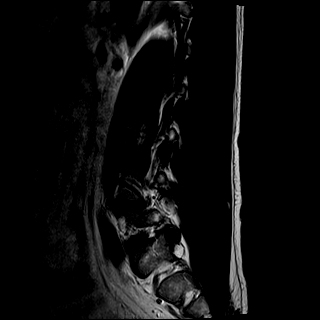
[im 7/17]
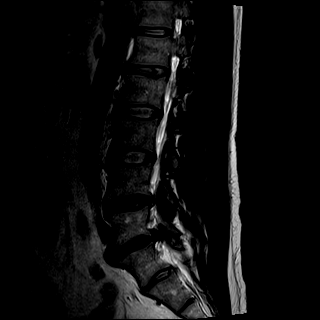
[im 10/17]
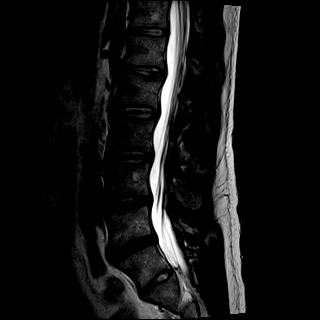
[im 13/17]
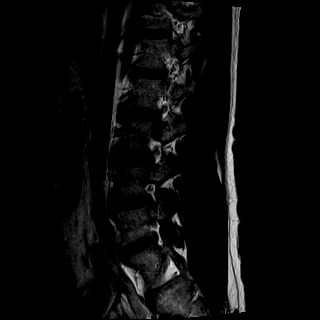
[im 17/17]
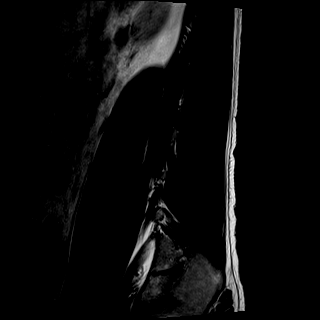

[Series 6: T1 · sagittal · 4.0mm · 0.81mm/px · 7 of 17 slices shown (1 of 2)]
[im 1/17]
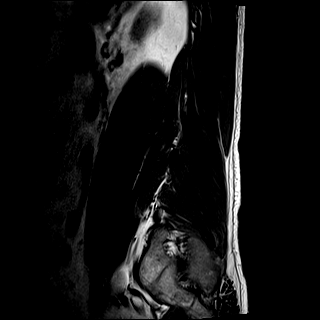
[im 3/17]
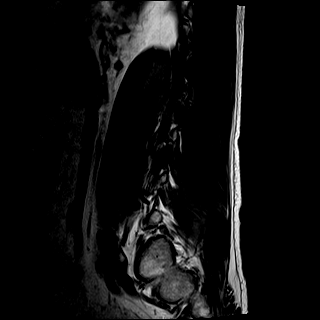
[im 6/17]
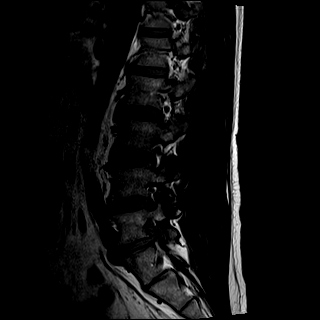
[im 9/17]
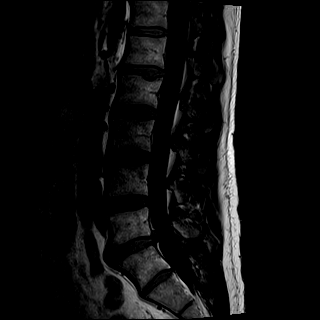
[im 11/17]
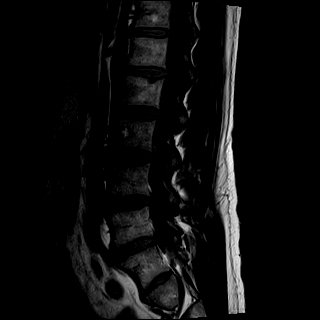
[im 14/17]
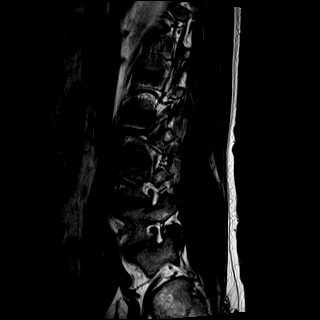
[im 17/17]
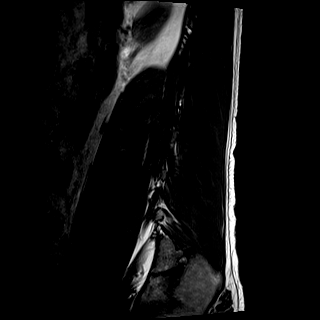

[Series 8: T2 · axial · 4.0mm · 0.78mm/px · z∈[-187,+22]mm · 8 of 36 slices shown (2 of 2)]
[im 1/36]
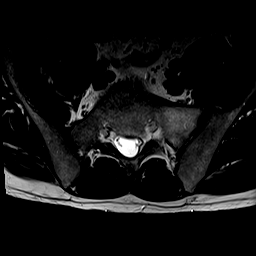
[im 6/36]
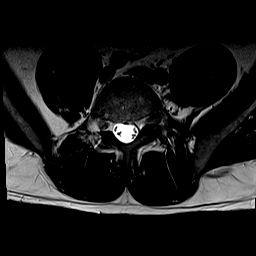
[im 11/36]
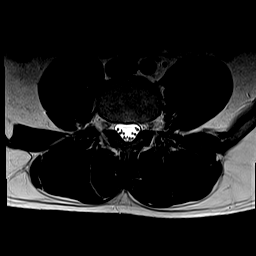
[im 17/36]
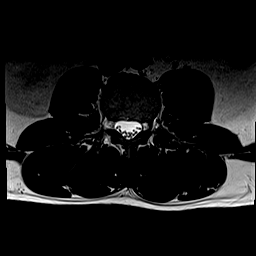
[im 19/36]
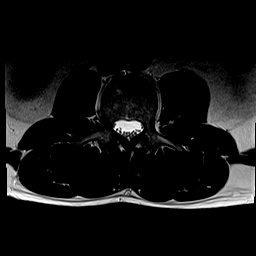
[im 25/36]
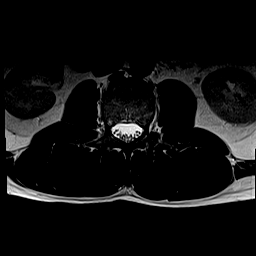
[im 30/36]
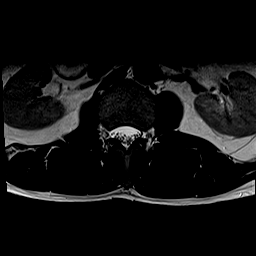
[im 36/36]
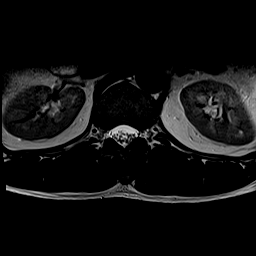

[Series 9: T1 · axial · 4.0mm · 0.39mm/px · z∈[-187,+22]mm · 8 of 36 slices shown (2 of 2)]
[im 1/36]
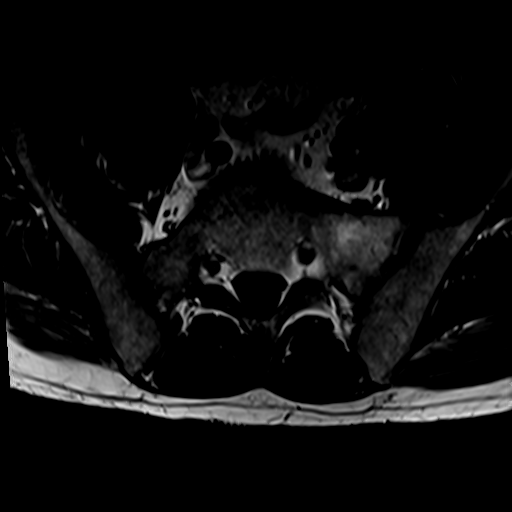
[im 6/36]
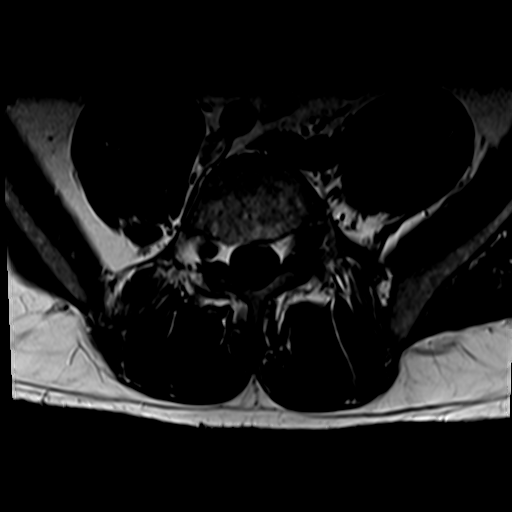
[im 11/36]
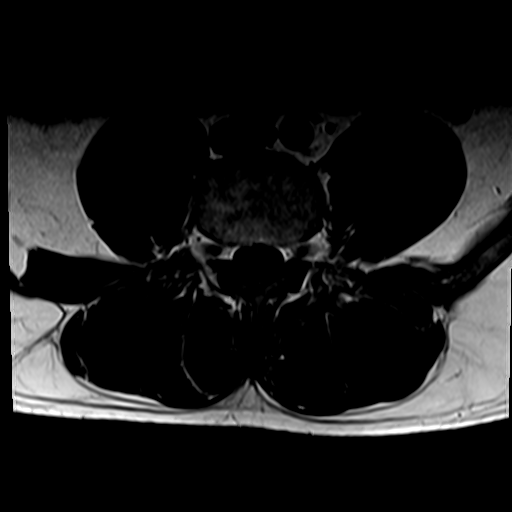
[im 17/36]
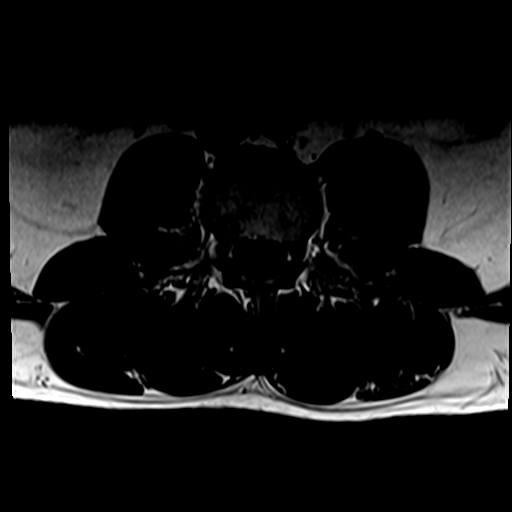
[im 19/36]
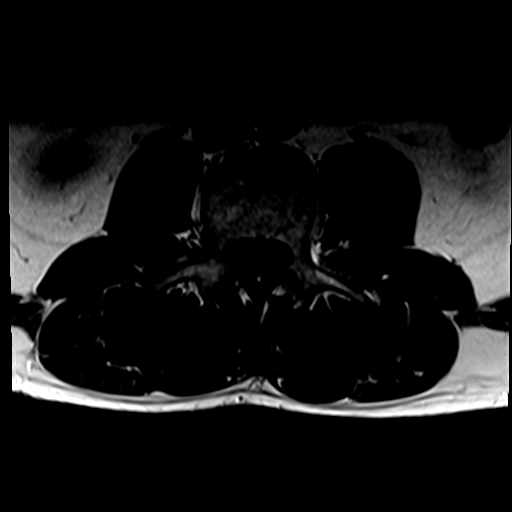
[im 25/36]
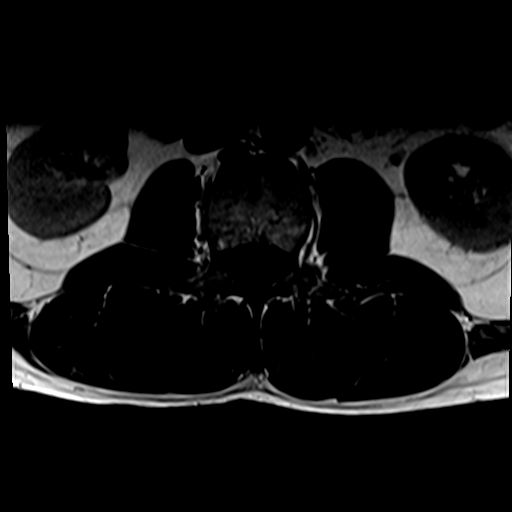
[im 30/36]
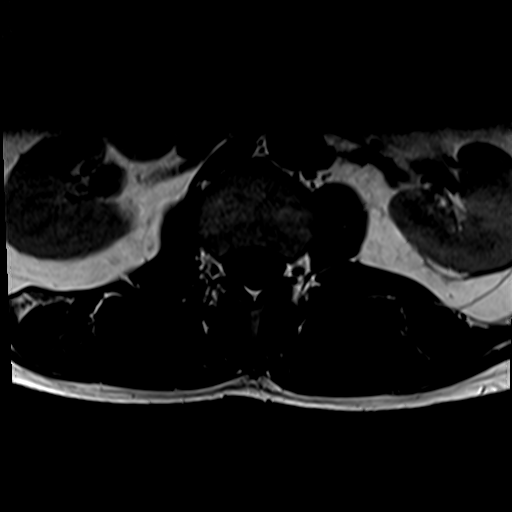
[im 36/36]
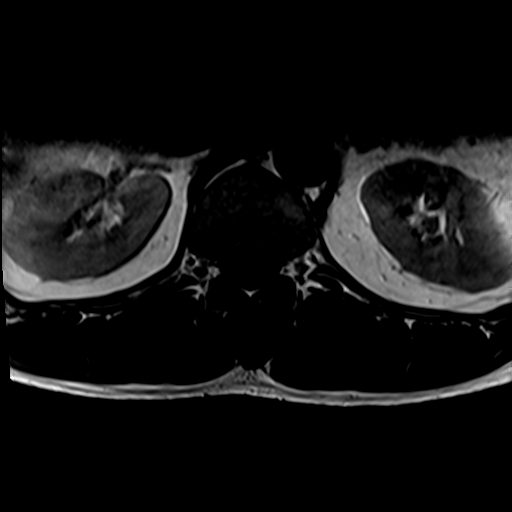

[29 of 48 positions shown; findings below may reference images not displayed]

FINDINGS: Segmentation: Transitional lumbosacral anatomy. For purposes of this
dictation, the S1 vertebra is transitional. Rudimentary disc space
and right-sided assimilation joint at S1-S2.

Alignment:  No significant spondylolisthesis.

Vertebrae: No vertebral compression deformity. Small Schmorl node
within the L2 superior endplate. Small multilevel vertebral body
hemangiomas. No significant marrow edema or focal suspicious osseous
lesion.

Conus medullaris and cauda equina: Conus extends to the T12 level.
The distal spinal cord is only minimally included in the field of
view.

Paraspinal and other soft tissues: No abnormality identified within
included portions of the abdomen/retroperitoneum. Paraspinal soft
tissues unremarkable.

Disc levels:

Minimal disc degeneration at L4. Mild-to-moderate disc degeneration
at L4-L5. Intervertebral disc height is otherwise maintained.

T12-L1: No significant disc herniation or stenosis.

L1-L2: No significant disc herniation or stenosis.

L2-L3: Tiny left foraminal/extraforaminal disc protrusion (series 8,
image 16) (series 5, image 12). No significant spinal canal or
foraminal stenosis.

L3-L4: No significant disc herniation or stenosis.

L4-L5: Small disc bulge with endplate spurring. Superimposed
broad-based left subarticular/foraminal disc protrusion at site of
posterior annular fissure. Mild facet arthrosis/ligamentum flavum
hypertrophy on the left. The disc protrusion contributes to mild
left subarticular narrowing without appreciable nerve root
impingement. Central canal patent. Borderline mild right neural
foraminal narrowing. Mild/moderate left neural foraminal narrowing.

L5-S1: Right foraminal zone posterior annular fissure. Disc bulge
with endplate spurring. Superimposed broad-based left center to left
foraminal disc protrusion. The disc protrusion results in left
subarticular stenosis, contacting and posteriorly displacing the
descending left S1 nerve root. The disc protrusion also contributes
to moderate left neural foraminal narrowing, contacting the exiting
left L5 nerve root.
IMPRESSION: Transitional lumbosacral anatomy, as described.

Lumbar spondylosis, as outlined and with findings most notably as
follows.

At L5-S1, there is mild-to-moderate disc degeneration. Right
foraminal zone posterior annular fissure. Disc bulge with endplate
spurring. Superimposed broad-based left center to left foraminal
disc protrusion. The disc protrusion results in left subarticular
stenosis, contacting and posteriorly displacing the descending left
S1 nerve root. Correlate for left S1 radiculopathy. The disc
protrusion also contributes to moderate left neural foraminal
narrowing, contacting the exiting left L5 nerve root. Correlate for
left L5 radiculopathy.

At L4-L5, there is mild disc degeneration. Small disc bulge with
endplate spurring. Superimposed broad-based left
subarticular/foraminal disc protrusion at site of posterior annular
fissure. Mild facet arthrosis/ligamentum flavum hypertrophy on the
left. The disc protrusion contributes to mild left subarticular
narrowing without appreciable nerve root impingement. Mild/moderate
left neural foraminal narrowing. Borderline mild right neural
foraminal narrowing.

No significant spinal canal or foraminal stenosis at the remaining
levels.

## 2023-04-06 IMAGING — CT CT ANGIO EXTREM UP*R*
1 of 3 series · 19 of 36 positions shown · IV contrast (APPLIED)
Comparison: CTA chest dated same day

CLINICAL DATA: Upper extremity artery dissection ischemic right
4-5th fingers probalbe embolism looking for source, subclavian
artery injury or aneurysm

EXAM:
CT OF THE UPPER RIGHT EXTREMITY WITH CONTRAST
TECHNIQUE: Multidetector CT imaging of the upper right extremity was performed
according to the standard protocol following intravenous contrast
administration.
CONTRAST:  125mL OMNIPAQUE IOHEXOL 350 MG/ML SOLN

[Series 4: axial rt arm angio · axial · 0.55mm/px · z∈[-802,-88]mm · 19 of 256 slices shown]
[im 9/256  soft-tissue]
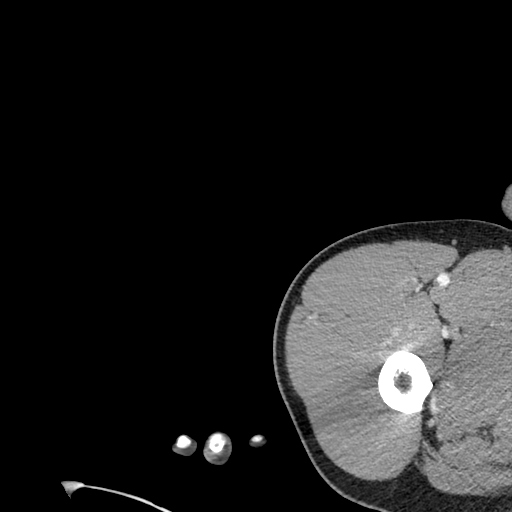
[im 25/256  bone]
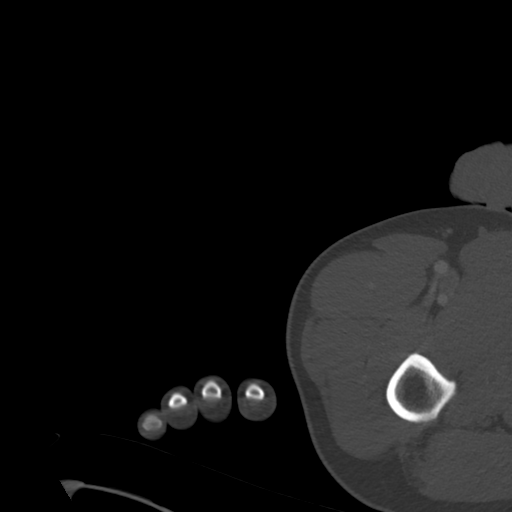
[im 33/256  soft-tissue]
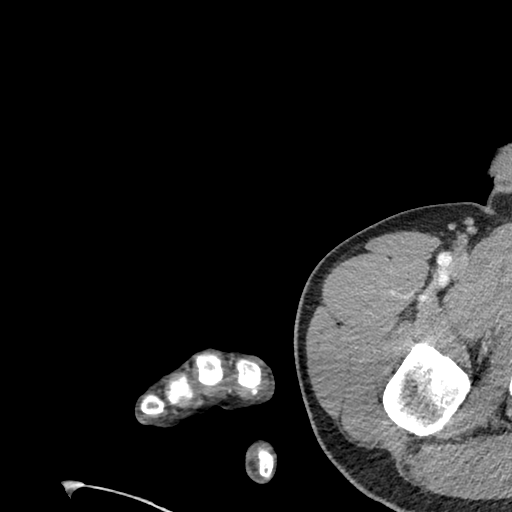
[im 50/256  bone]
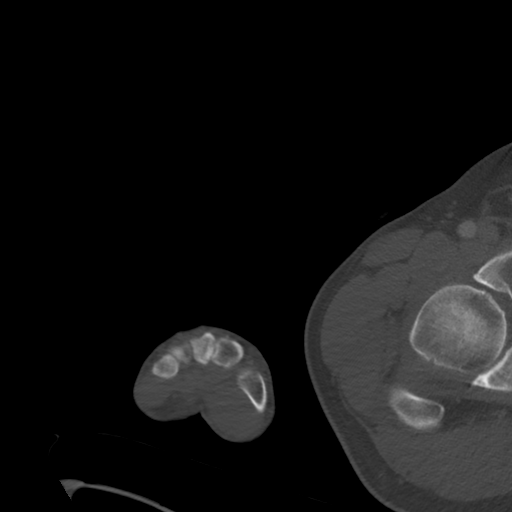
[im 66/256  soft-tissue]
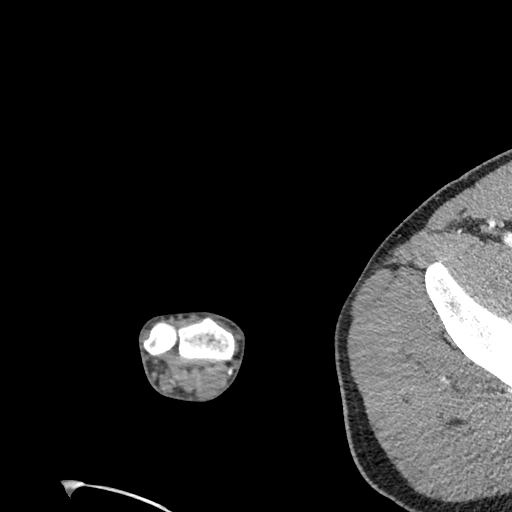
[im 75/256  bone]
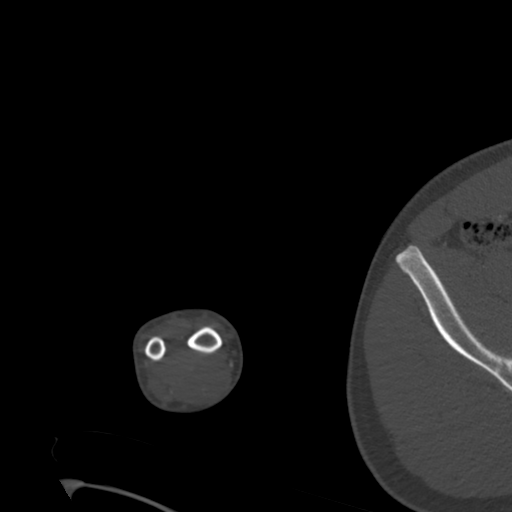
[im 91/256  soft-tissue]
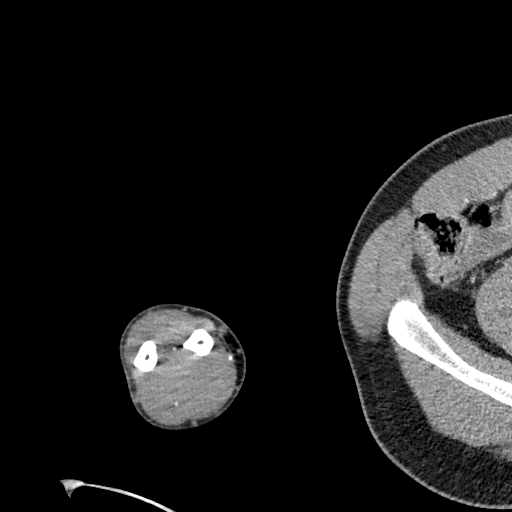
[im 99/256  bone]
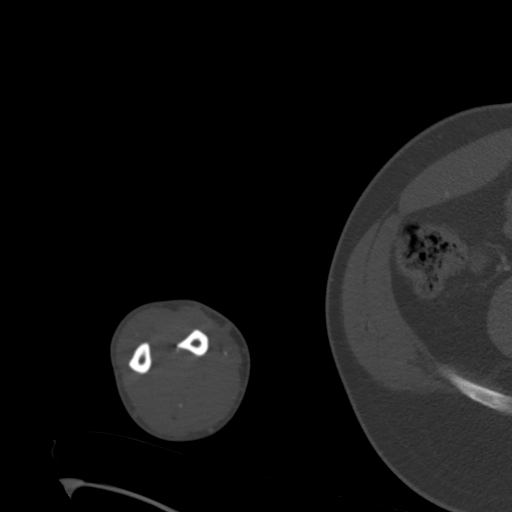
[im 116/256  soft-tissue]
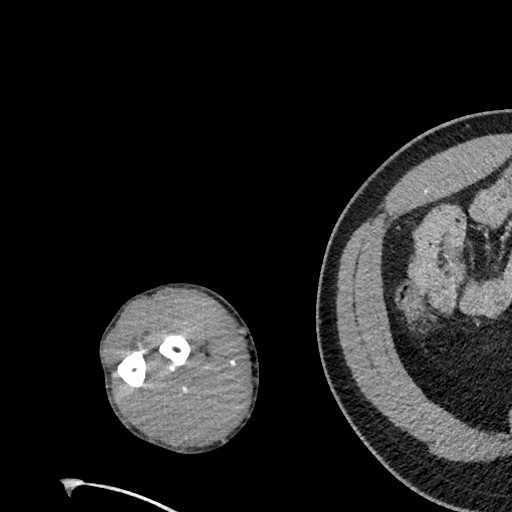
[im 132/256  bone]
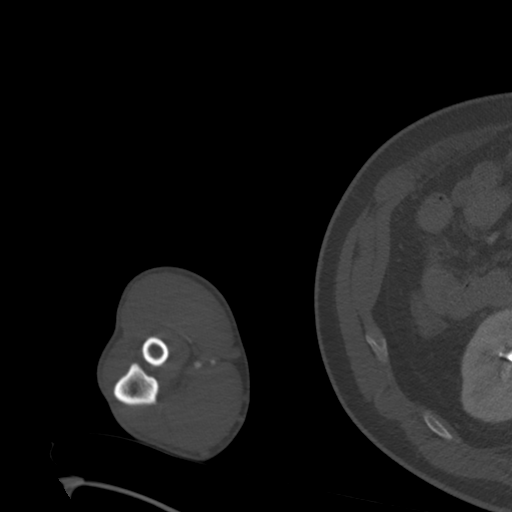
[im 140/256  soft-tissue]
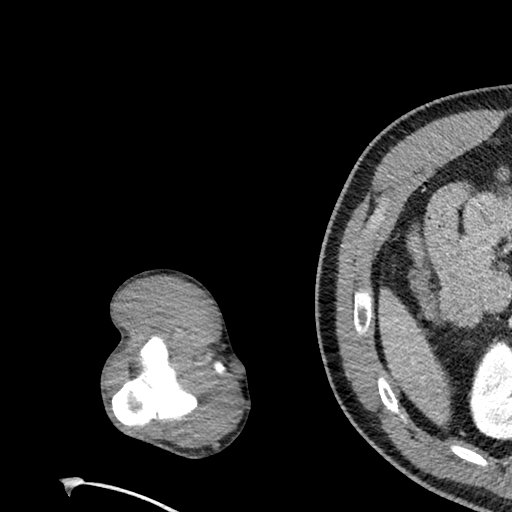
[im 157/256  bone]
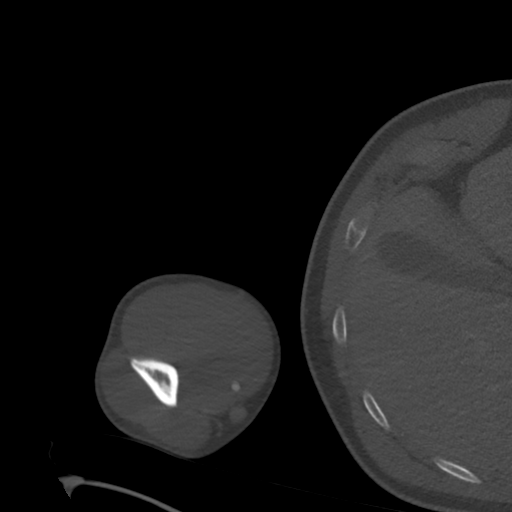
[im 165/256  soft-tissue]
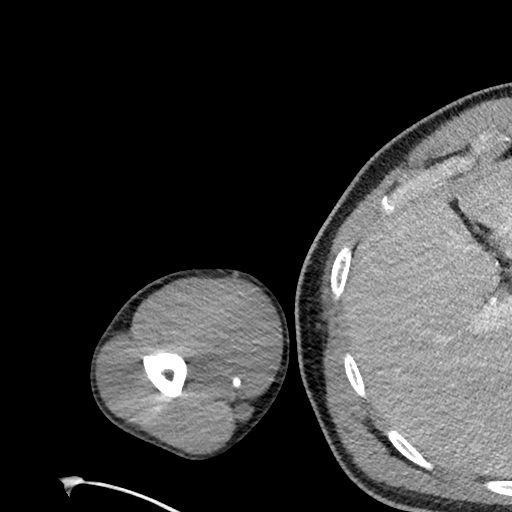
[im 181/256  bone]
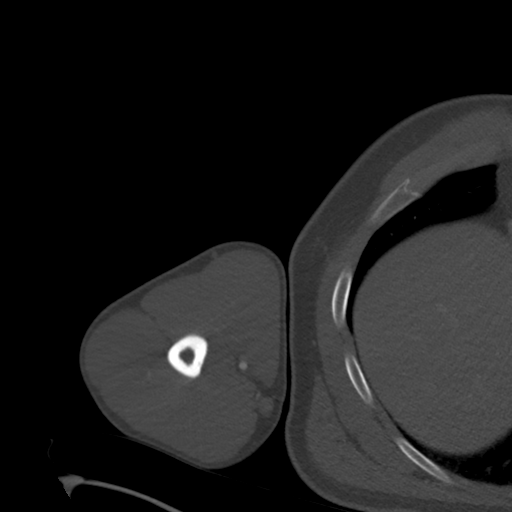
[im 190/256  soft-tissue]
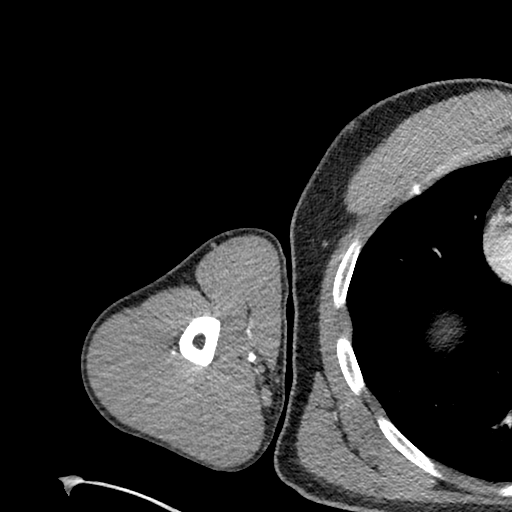
[im 206/256  bone]
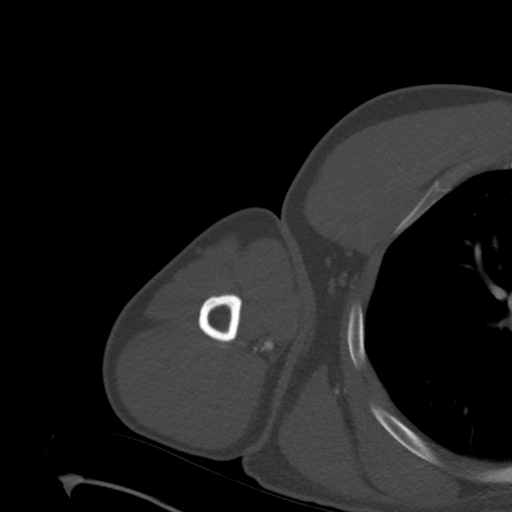
[im 223/256  soft-tissue]
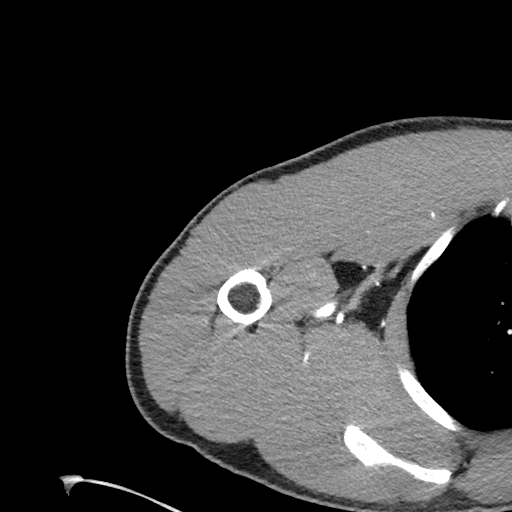
[im 231/256  bone]
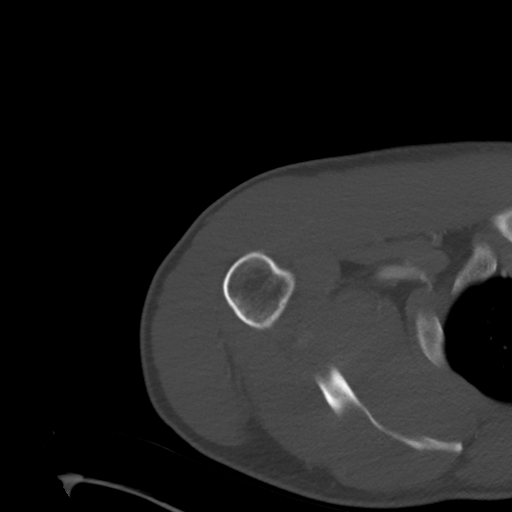
[im 247/256  soft-tissue]
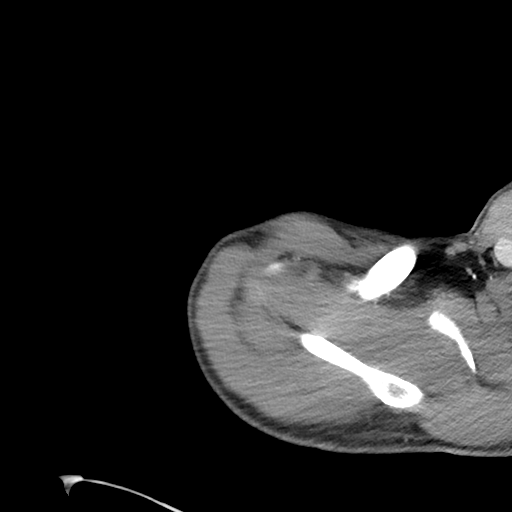

[19 of 36 positions shown; findings below may reference images not displayed]

FINDINGS: VASCULAR

Subclavian artery: Patent without aneurysm or dissection.

Axillary artery: Patent without aneurysm or dissection.

Brachial artery: Patent without aneurysm or dissection. No findings
of fibromuscular dysplasia.

Radial artery: Patent without aneurysm or dissection to the level of
the wrist.

Ulnar artery: Patent without aneurysm or dissection to the level of
the wrist.

Palmar/digital arteries: Not well evaluated on this exam.

NONVASCULAR

No aggressive osseous lesions.  The soft tissues are unremarkable.
IMPRESSION: The right upper extremity arteries are patent without aneurysm or
dissection. The radial and ulnar arteries are visualized to the
level of the wrist. The arteries of the hand are not well evaluated
on this exam.

## 2023-09-28 ENCOUNTER — Ambulatory Visit: Payer: 59

## 2023-09-28 DIAGNOSIS — K640 First degree hemorrhoids: Secondary | ICD-10-CM | POA: Diagnosis not present

## 2023-09-28 DIAGNOSIS — K5730 Diverticulosis of large intestine without perforation or abscess without bleeding: Secondary | ICD-10-CM | POA: Diagnosis not present

## 2023-09-28 DIAGNOSIS — Z1211 Encounter for screening for malignant neoplasm of colon: Secondary | ICD-10-CM | POA: Diagnosis present

## 2023-09-28 DIAGNOSIS — Z83719 Family history of colon polyps, unspecified: Secondary | ICD-10-CM | POA: Diagnosis not present
